# Patient Record
Sex: Male | Born: 1970 | Race: White | Hispanic: No | Marital: Married | Smoking: Current every day smoker
Health system: Southern US, Community
[De-identification: ages and names within clinical notes are randomized; demographics above are authoritative.]

## PROBLEM LIST (undated history)

## (undated) DIAGNOSIS — E7800 Pure hypercholesterolemia, unspecified: Secondary | ICD-10-CM

---

## 2001-05-08 ENCOUNTER — Encounter: Payer: Self-pay | Admitting: Urology

## 2001-05-08 ENCOUNTER — Ambulatory Visit (HOSPITAL_COMMUNITY): Admission: RE | Admit: 2001-05-08 | Discharge: 2001-05-08 | Payer: Self-pay | Admitting: Urology

## 2001-11-12 ENCOUNTER — Encounter (HOSPITAL_COMMUNITY): Admission: RE | Admit: 2001-11-12 | Discharge: 2001-12-12 | Payer: Self-pay | Admitting: Preventative Medicine

## 2002-09-16 ENCOUNTER — Encounter: Payer: Self-pay | Admitting: Internal Medicine

## 2002-09-16 ENCOUNTER — Ambulatory Visit (HOSPITAL_COMMUNITY): Admission: RE | Admit: 2002-09-16 | Discharge: 2002-09-16 | Payer: Self-pay | Admitting: Internal Medicine

## 2007-12-22 ENCOUNTER — Encounter: Payer: Self-pay | Admitting: Orthopedic Surgery

## 2007-12-22 ENCOUNTER — Emergency Department (HOSPITAL_COMMUNITY): Admission: EM | Admit: 2007-12-22 | Discharge: 2007-12-22 | Payer: Self-pay | Admitting: Emergency Medicine

## 2007-12-24 ENCOUNTER — Ambulatory Visit: Payer: Self-pay | Admitting: Orthopedic Surgery

## 2007-12-24 DIAGNOSIS — S52539A Colles' fracture of unspecified radius, initial encounter for closed fracture: Secondary | ICD-10-CM

## 2007-12-26 ENCOUNTER — Encounter: Payer: Self-pay | Admitting: Orthopedic Surgery

## 2008-01-22 ENCOUNTER — Ambulatory Visit: Payer: Self-pay | Admitting: Orthopedic Surgery

## 2008-02-05 ENCOUNTER — Ambulatory Visit: Payer: Self-pay | Admitting: Orthopedic Surgery

## 2008-02-20 ENCOUNTER — Ambulatory Visit: Payer: Self-pay | Admitting: Orthopedic Surgery

## 2012-05-10 LAB — TESTOSTERONE, FREE
Testosterone, % Free: 2
Testosterone,Free: 66.7

## 2016-01-10 ENCOUNTER — Emergency Department (HOSPITAL_COMMUNITY)
Admission: EM | Admit: 2016-01-10 | Discharge: 2016-01-10 | Disposition: A | Discharge status: Home / Self Care | Payer: PRIVATE HEALTH INSURANCE | Attending: Emergency Medicine | Admitting: Emergency Medicine

## 2016-01-10 ENCOUNTER — Encounter (HOSPITAL_COMMUNITY): Payer: Self-pay | Admitting: Emergency Medicine

## 2016-01-10 ENCOUNTER — Emergency Department (HOSPITAL_COMMUNITY): Payer: PRIVATE HEALTH INSURANCE

## 2016-01-10 DIAGNOSIS — F17210 Nicotine dependence, cigarettes, uncomplicated: Secondary | ICD-10-CM | POA: Insufficient documentation

## 2016-01-10 DIAGNOSIS — R0789 Other chest pain: Secondary | ICD-10-CM | POA: Diagnosis not present

## 2016-01-10 HISTORY — DX: Pure hypercholesterolemia, unspecified: E78.00

## 2016-01-10 LAB — BASIC METABOLIC PANEL
Anion gap: 7 (ref 5–15)
BUN: 16 mg/dL (ref 6–20)
CALCIUM: 8.9 mg/dL (ref 8.9–10.3)
CO2: 24 mmol/L (ref 22–32)
Chloride: 109 mmol/L (ref 101–111)
Creatinine, Ser: 0.96 mg/dL (ref 0.61–1.24)
GFR calc Af Amer: >60 mL/min (ref >60)
GLUCOSE: 92 mg/dL (ref 65–99)
Potassium: 3.6 mmol/L (ref 3.5–5.1)
SODIUM: 140 mmol/L (ref 135–145)

## 2016-01-10 LAB — CBC
HCT: 46.8 % (ref 39.0–52.0)
Hemoglobin: 16.2 g/dL (ref 13.0–17.0)
MCH: 31.5 pg (ref 26.0–34.0)
MCHC: 34.6 g/dL (ref 30.0–36.0)
MCV: 91.1 fL (ref 78.0–100.0)
PLATELETS: 292 10*3/uL (ref 150–400)
RBC: 5.14 MIL/uL (ref 4.22–5.81)
RDW: 12.8 % (ref 11.5–15.5)
WBC: 9.3 10*3/uL (ref 4.0–10.5)

## 2016-01-10 LAB — TROPONIN I

## 2016-01-10 LAB — D-DIMER, QUANTITATIVE (NOT AT ARMC): D DIMER QUANT: 0.28 ug{FEU}/mL (ref 0.00–0.50)

## 2016-01-10 NOTE — ED Provider Notes (Signed)
[facility] DEPT Provider Note   CSN: [id number] Arrival date & time: 01/10/16  1357     History   Chief Complaint Chief Complaint  Patient presents with  . Chest Pain    HPI Stephen Marshall is a 45 y.o. male.  The patient is a 45 year old male who reports that he has a history of high cholesterol but does not take medications, he also smokes occasional cigarettes but has no history of diabetes, no history of cardiac disease, no history of recent travel trauma injury surgery swelling or cancer. He reports that he has several family members who did have cardiac disease at an early age. He reports that 3 days ago he started to have a discomfort in his mid chest, this started while he was at rest on a tractor, it has continued constantly for the last 72 hours, there is nothing that seems to make this better or worse there is nothing that seems to cause the pain to intensify, it is associated with a electric shooting sensation down his left arm, it is not associated with shortness of breath, nausea, vomiting, diaphoresis or any other symptoms including fevers chills or coughing. He denies any personal history of pulmonary or cardiac abnormalities. He states that he was able to walk a mile today and this did not make his pain worse. Again it has been constant without relief for 72 hours    Chest Pain      Past Medical History:  Diagnosis Date  . High cholesterol     Patient Active Problem List   Diagnosis Date Noted  . COLLES' FRACTURE, RIGHT WRIST 12/24/2007    History reviewed. No pertinent surgical history.     Home Medications    Prior to Admission medications   Not on File    Family History Family History  Problem Relation Age of Onset  . Heart attack Father 86  . Cancer Father   . Stroke Father 51    Social History Social History  Substance Use Topics  . Smoking status: Current Every Day Smoker    Types: E-cigarettes  . Smokeless tobacco: Never Used   Comment: vape  . Alcohol use Yes     Allergies   Review of patient's allergies indicates no known allergies.   Review of Systems Review of Systems  Cardiovascular: Positive for chest pain.  All other systems reviewed and are negative.    Physical Exam Updated Vital Signs BP 126/87 (BP Location: Left Arm)   Pulse 88   Temp 98.7 F (37.1 C) (Oral)   Resp 18   Ht 5\' 11"  (1.803 m)   Wt 250 lb (113.4 kg)   SpO2 97%   BMI 34.87 kg/m   Physical Exam  Constitutional: He appears well-developed and well-nourished. No distress.  HENT:  Head: Normocephalic and atraumatic.  Mouth/Throat: Oropharynx is clear and moist. No oropharyngeal exudate.  Eyes: Conjunctivae and EOM are normal. Pupils are equal, round, and reactive to light. Right eye exhibits no discharge. Left eye exhibits no discharge. No scleral icterus.  Neck: Normal range of motion. Neck supple. No JVD present. No thyromegaly present.  Cardiovascular: Normal rate, regular rhythm, normal heart sounds and intact distal pulses.  Exam reveals no gallop and no friction rub.   No murmur heard. Pulmonary/Chest: Effort normal and breath sounds normal. No respiratory distress. He has no wheezes. He has no rales.  Abdominal: Soft. Bowel sounds are normal. He exhibits no distension and no mass. There is no tenderness.  Musculoskeletal: Normal range of motion. He exhibits no edema or tenderness.  Lymphadenopathy:    He has no cervical adenopathy.  Neurological: He is alert. Coordination normal.  Skin: Skin is warm and dry. No rash noted. No erythema.  Psychiatric: He has a normal mood and affect. His behavior is normal.  Nursing note and vitals reviewed.    ED Treatments / Results  Labs (all labs ordered are listed, but only abnormal results are displayed) Labs Reviewed  BASIC METABOLIC PANEL  CBC  TROPONIN I    EKG  EKG Interpretation  Date/Time:  Sunday January 10 2016 14:03:41 EDT Ventricular Rate:  92 PR  Interval:    QRS Duration: 86 QT Interval:  360 QTC Calculation: 446 R Axis:   100 Text Interpretation:  Sinus rhythm Ventricular premature complex Right axis deviation Baseline wander in lead(s) V1 No old tracing to compare Confirmed by Annalisia Ingber  MD, [place] ([id number]) on 01/10/2016 2:24:57 PM       Radiology No results found.  Procedures Procedures (including critical care time)  Medications Ordered in ED Medications - No data to display   Initial Impression / Assessment and Plan / ED Course  I have reviewed the triage vital signs and the nursing notes.  Pertinent labs & imaging results that were available during my care of the patient were reviewed by me and considered in my medical decision making (see chart for details).  Clinical Course    The labs have been ordered including d dimer and trop Exam unremarkable including VS and ECG Pt agreeable to w/u - HEART Pathway score of 3 - needs second trop  D/w Dr. Vanita Panda who will f/u second trop and d dimer at change of shift.  Final Clinical Impressions(s) / ED Diagnoses   Final diagnoses:  None    New Prescriptions New Prescriptions   No medications on file     Noemi Chapel, MD 01/10/16 1534

## 2016-01-10 NOTE — Discharge Instructions (Signed)
As discussed, your evaluation today has been largely reassuring.  But, it is important that you monitor your condition carefully, and do not hesitate to return to the ED if you develop new, or concerning changes in your condition. ? ?Otherwise, please follow-up with your physician for appropriate ongoing care. ? ?

## 2016-01-10 NOTE — ED Provider Notes (Signed)
6:35 PM Patient awake, alert. He and his wife are aware of all findings, including normal d-dimer, serial negative troponins. Patient has clear follow-up instructions, was discharged in stable condition.   Carmin Muskrat, MD 01/10/16 [phone]

## 2016-01-10 NOTE — ED Notes (Signed)
MD at bedside. 

## 2016-01-10 NOTE — ED Triage Notes (Signed)
Pt reports cp in left side of chest radiating to left arm x3 days.  Denies associated symptoms.  Pt also has had left sided facial numbness 2 hours ago, not numb at this time. Pt alert and oriented.

## 2016-01-13 ENCOUNTER — Encounter: Payer: Self-pay | Admitting: Cardiology

## 2016-01-13 ENCOUNTER — Ambulatory Visit (INDEPENDENT_AMBULATORY_CARE_PROVIDER_SITE_OTHER): Payer: PRIVATE HEALTH INSURANCE | Admitting: Cardiology

## 2016-01-13 VITALS — BP 112/78 | HR 90 | Ht 69.0 in | Wt 252.0 lb

## 2016-01-13 DIAGNOSIS — R0789 Other chest pain: Secondary | ICD-10-CM

## 2016-01-13 MED ORDER — IBUPROFEN 200 MG PO CAPS: 400.0000 mg | ORAL_CAPSULE | Freq: Three times a day (TID) | ORAL | 0 refills | Status: DC

## 2016-01-13 NOTE — Patient Instructions (Signed)
Medication Instructions:  Take OTC IBUPROFEN 400 MG - THREE TIMES DAILY FOR THE NEXT 7 DAYS   Labwork: NONE  Testing/Procedures: NONE  Follow-Up: Your physician recommends that you schedule a follow-up appointment in: 2 WEEKS    Any Other Special Instructions Will Be Listed Below (If Applicable).     If you need a refill on your cardiac medications before your next appointment, please call your pharmacy.

## 2016-01-13 NOTE — Progress Notes (Signed)
     Clinical Summary Stephen Marshall is a 45 y.o.male seen as a new patient, he is referred by Dr Noemi Chapel   1. Chest pain - seen in ER 01/10/16 with chest pain - negative troponins, D-dimer in ER. EKG with SR and no ischemic changes . CXR no acute process. - chest pain started about 1 week ago. Left sided, pressure like pain. 3/10 in severity. Occurred at rest. No other associated symptoms. Not positional. Lasted few hours constant. No relation to food - few days later had recurrent pain. Sunday started radiating down left arm. +belching around that time. Not better with tums. Lasted constant all weekend. Could be worst with deep breathing - has muscle spasm in arm as well aroudn tha ttime.    CAD risk factors: HL, mild tobacco x 3 years, father MI and CVA in 82s, maternal grandfather MI 51s.   Past Medical History:  Diagnosis Date  . High cholesterol      No Known Allergies   No current outpatient prescriptions on file.   No current facility-administered medications for this visit.      No past surgical history on file.   No Known Allergies    Family History  Problem Relation Age of Onset  . Heart attack Father 76  . Cancer Father   . Stroke Father 40     Social History Mr. Grannan reports that he has been smoking E-cigarettes.  He has never used smokeless tobacco. Mr. Monjaraz reports that he drinks alcohol.   Review of Systems CONSTITUTIONAL: No weight loss, fever, chills, weakness or fatigue.  HEENT: Eyes: No visual loss, blurred vision, double vision or yellow sclerae.No hearing loss, sneezing, congestion, runny nose or sore throat.  SKIN: No rash or itching.  CARDIOVASCULAR: per hpi RESPIRATORY: No shortness of breath, cough or sputum.  GASTROINTESTINAL: No anorexia, nausea, vomiting or diarrhea. No abdominal pain or blood.  GENITOURINARY: No burning on urination, no polyuria NEUROLOGICAL: No headache, dizziness, syncope, paralysis, ataxia, numbness or  tingling in the extremities. No change in bowel or bladder control.  MUSCULOSKELETAL: No muscle, back pain, joint pain or stiffness.  LYMPHATICS: No enlarged nodes. No history of splenectomy.  PSYCHIATRIC: No history of depression or anxiety.  ENDOCRINOLOGIC: No reports of sweating, cold or heat intolerance. No polyuria or polydipsia.  Marland Kitchen   Physical Examination Vitals:   01/13/16 1445  BP: 112/78  Pulse: 90   Vitals:   01/13/16 1445  Weight: 252 lb (114.3 kg)  Height: 5\' 9"  (1.753 m)    Gen: resting comfortably, no acute distress HEENT: no scleral icterus, pupils equal round and reactive, no palptable cervical adenopathy,  CV: RRR, no m/r/g, no jvd Resp: Clear to auscultation bilaterally GI: abdomen is soft, non-tender, non-distended, normal bowel sounds, no hepatosplenomegaly MSK: extremities are warm, no edema.  Skin: warm, no rash Neuro:  no focal deficits Psych: appropriate affect     Assessment and Plan  1. Chest pain - atypical chest pain, not cardiac in description. Probable MSK related pain - he will take ibuprofen 400mg  tid for 7 days and follow symptoms - no further cardiac testing at this time. He is to contact us if symptoms change or progress       Arnoldo Lenis, M.D.

## 2016-10-13 ENCOUNTER — Ambulatory Visit (HOSPITAL_COMMUNITY)
Admission: RE | Admit: 2016-10-13 | Discharge: 2016-10-13 | Disposition: A | Discharge status: Home / Self Care | Payer: PRIVATE HEALTH INSURANCE | Source: Ambulatory Visit | Attending: Pulmonary Disease | Admitting: Pulmonary Disease

## 2016-10-13 ENCOUNTER — Other Ambulatory Visit (HOSPITAL_COMMUNITY): Payer: Self-pay | Admitting: Pulmonary Disease

## 2016-10-13 DIAGNOSIS — R059 Cough, unspecified: Secondary | ICD-10-CM

## 2016-10-13 DIAGNOSIS — R05 Cough: Secondary | ICD-10-CM

## 2018-10-02 ENCOUNTER — Other Ambulatory Visit: Payer: Self-pay | Admitting: Pulmonary Disease

## 2018-10-02 ENCOUNTER — Ambulatory Visit (HOSPITAL_COMMUNITY)
Admission: RE | Admit: 2018-10-02 | Discharge: 2018-10-02 | Disposition: A | Discharge status: Home / Self Care | Payer: PRIVATE HEALTH INSURANCE | Source: Ambulatory Visit | Attending: Pulmonary Disease | Admitting: Pulmonary Disease

## 2018-10-02 ENCOUNTER — Other Ambulatory Visit: Payer: Self-pay

## 2018-10-02 DIAGNOSIS — I159 Secondary hypertension, unspecified: Secondary | ICD-10-CM | POA: Insufficient documentation

## 2018-10-02 DIAGNOSIS — R519 Headache, unspecified: Secondary | ICD-10-CM

## 2018-10-02 DIAGNOSIS — R51 Headache: Secondary | ICD-10-CM | POA: Insufficient documentation

## 2018-10-04 ENCOUNTER — Other Ambulatory Visit: Payer: PRIVATE HEALTH INSURANCE

## 2018-10-04 ENCOUNTER — Telehealth: Payer: Self-pay | Admitting: *Deleted

## 2018-10-04 DIAGNOSIS — Z20822 Contact with and (suspected) exposure to covid-19: Secondary | ICD-10-CM

## 2018-10-04 NOTE — Telephone Encounter (Signed)
I received a call from [place] at Dr. Sinda Du office requesting COVID-19 testing be scheduled for this pt.   I called pt and scheduled him for today at 1:15 for COVID-19 testing at the [facility] in [place].   I made him aware to wear a mask and stay in the car.  Order entered  [employer]

## 2018-10-08 LAB — NOVEL CORONAVIRUS, NAA: SARS-CoV-2, NAA: NOT DETECTED

## 2019-04-08 ENCOUNTER — Ambulatory Visit: Payer: PRIVATE HEALTH INSURANCE | Attending: Internal Medicine

## 2019-04-08 ENCOUNTER — Other Ambulatory Visit: Payer: Self-pay

## 2019-04-08 DIAGNOSIS — Z20822 Contact with and (suspected) exposure to covid-19: Secondary | ICD-10-CM

## 2019-04-10 LAB — NOVEL CORONAVIRUS, NAA: SARS-CoV-2, NAA: NOT DETECTED

## 2019-05-04 ENCOUNTER — Telehealth: Payer: Self-pay

## 2019-05-04 NOTE — Telephone Encounter (Signed)
Opened to abstract chart

## 2019-07-15 ENCOUNTER — Ambulatory Visit: Payer: PRIVATE HEALTH INSURANCE | Admitting: Family Medicine

## 2019-12-12 IMAGING — CT CT HEAD WITHOUT CONTRAST
3 series · 15 of 47 positions shown, 18 images · non-contrast
Comparison: None.

CLINICAL DATA: Headaches for 6 days with hypertension.

EXAM:
CT HEAD WITHOUT CONTRAST
TECHNIQUE: Contiguous axial images were obtained from the base of the skull
through the vertex without intravenous contrast.

[Series 2: head w o · axial · 0.47mm/px · z∈[+79,+214]mm · 9 of 33 slices shown, 12 images]
[im 3/33  brain]
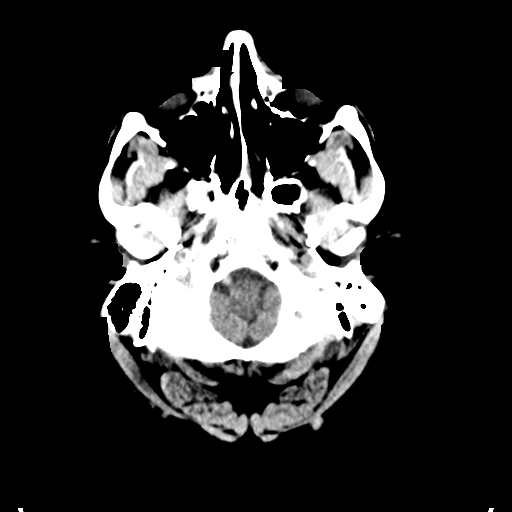
[im 3/33  bone]
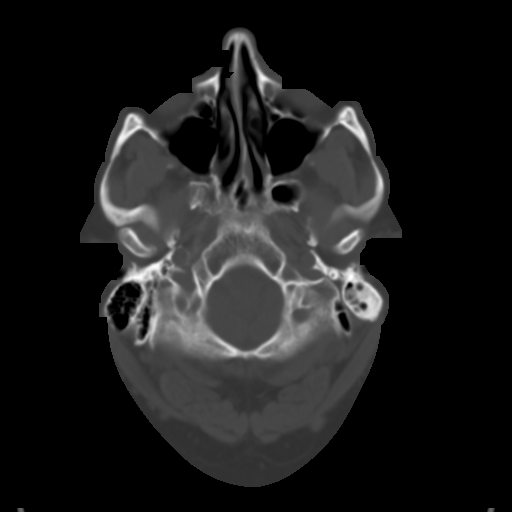
[im 6/33  brain]
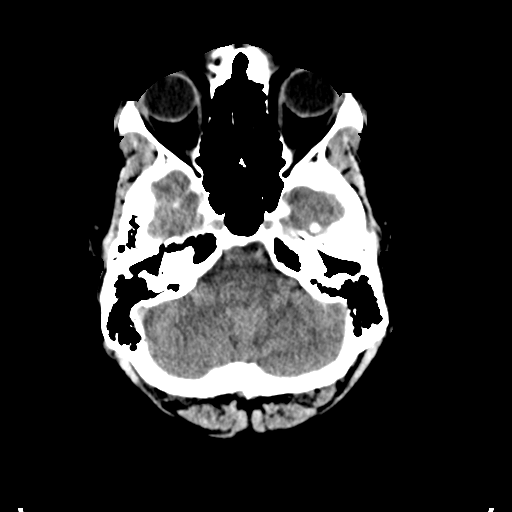
[im 9/33  brain]
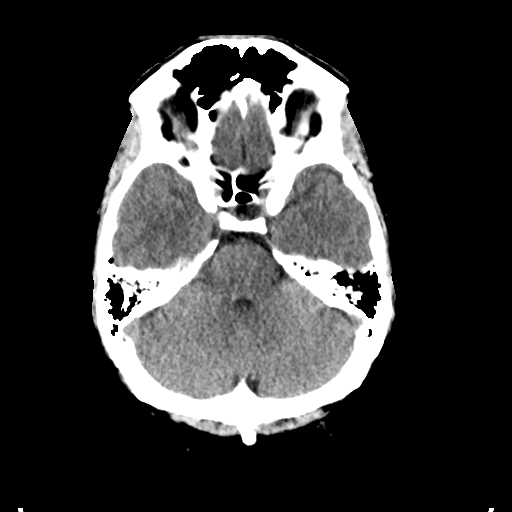
[im 13/33  brain]
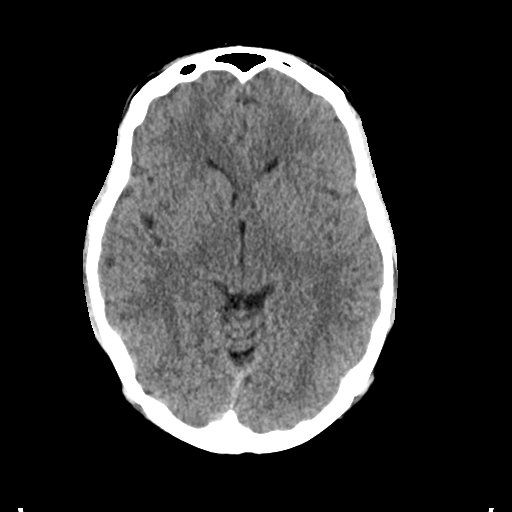
[im 17/33  brain]
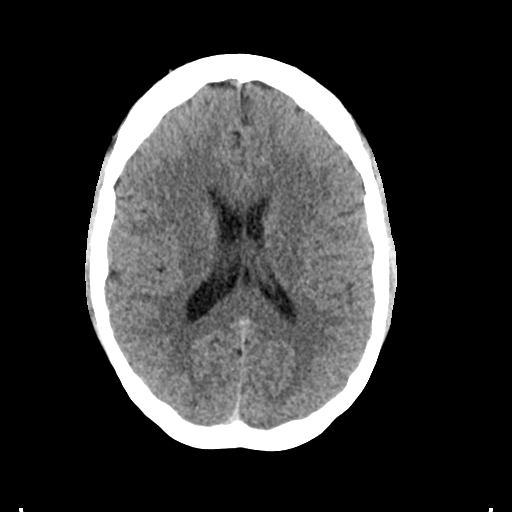
[im 17/33  bone]
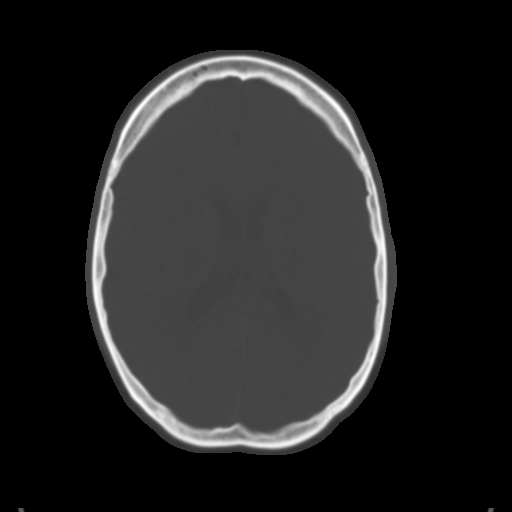
[im 20/33  brain]
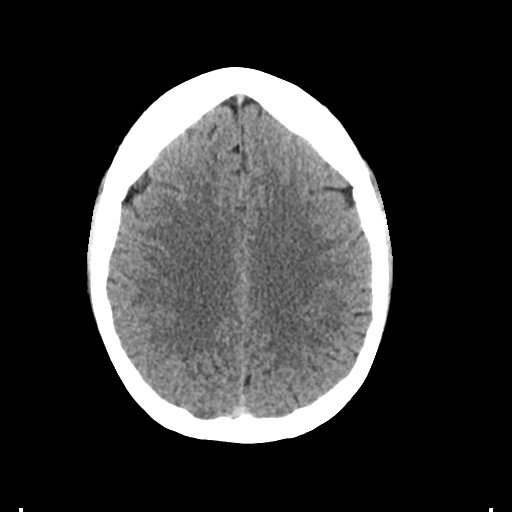
[im 24/33  brain]
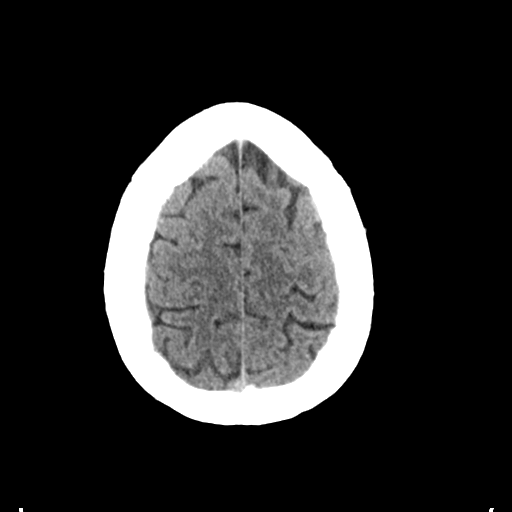
[im 27/33  brain]
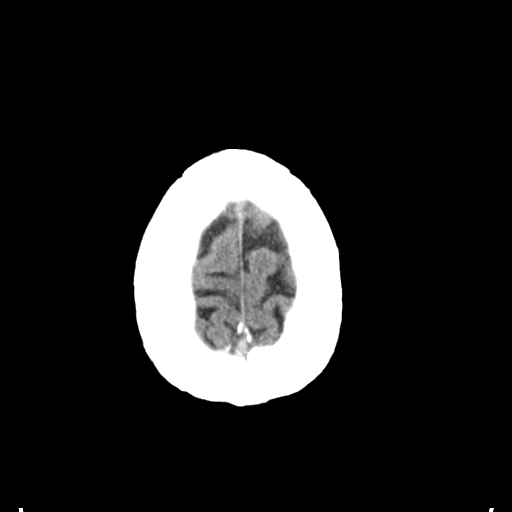
[im 30/33  brain]
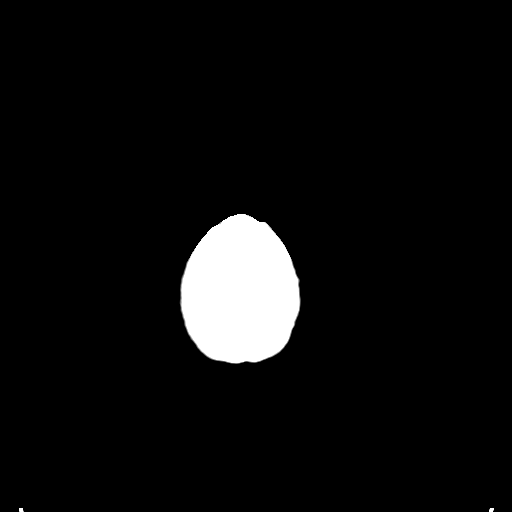
[im 30/33  bone]
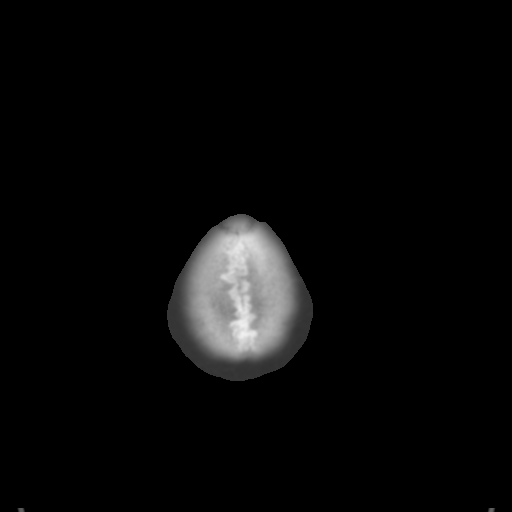

[Series 4: coronal soft · coronal · 0.31mm/px · 3 of 72 slices shown]
[im 24/72  brain]
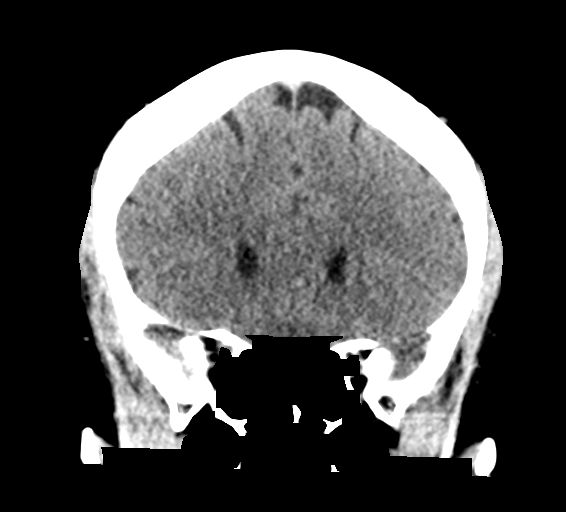
[im 32/72  brain]
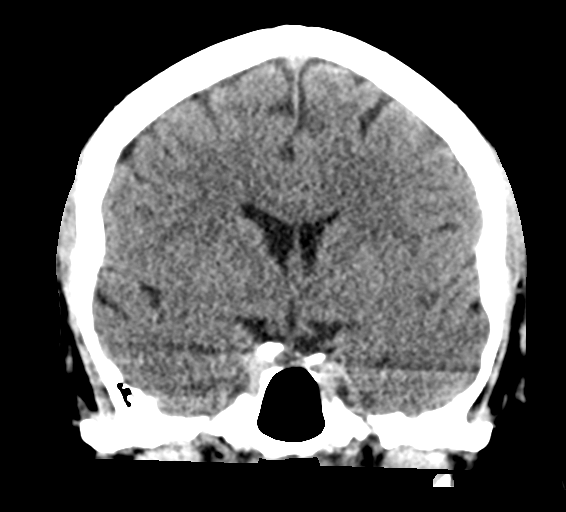
[im 40/72  brain]
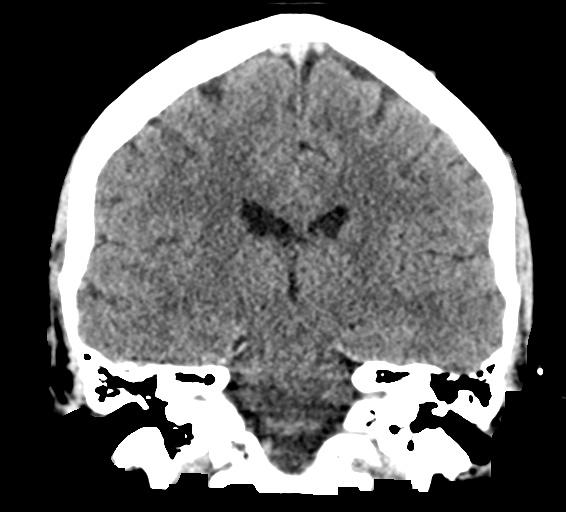

[Series 5: sagittal soft · sagittal · 0.31mm/px · 3 of 60 slices shown]
[im 20/60  brain]
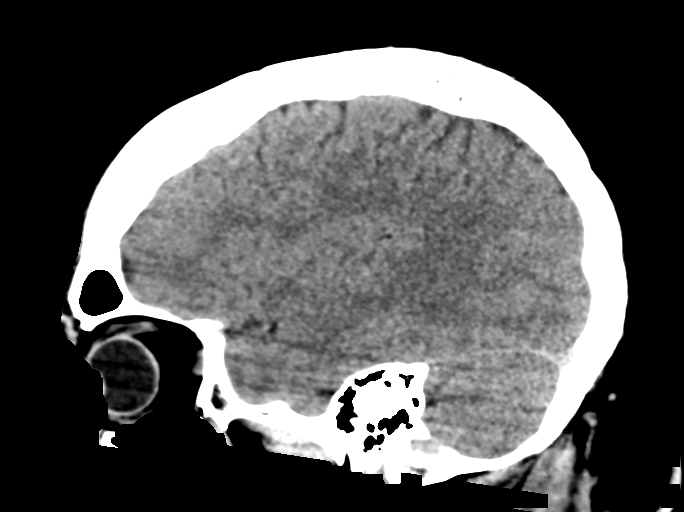
[im 30/60  brain]
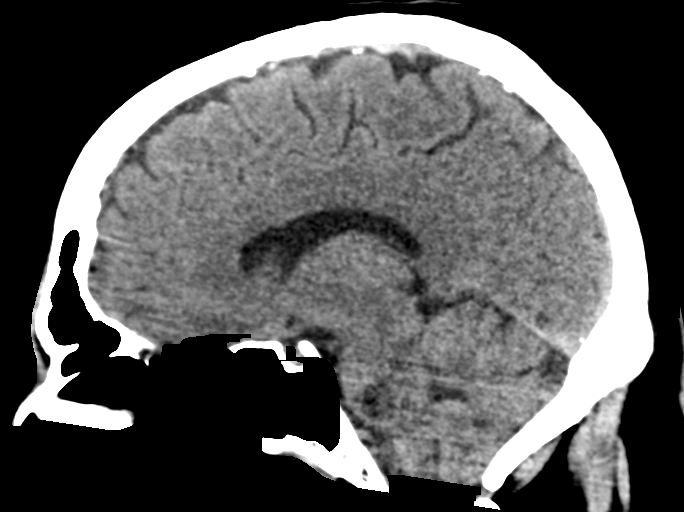
[im 40/60  brain]
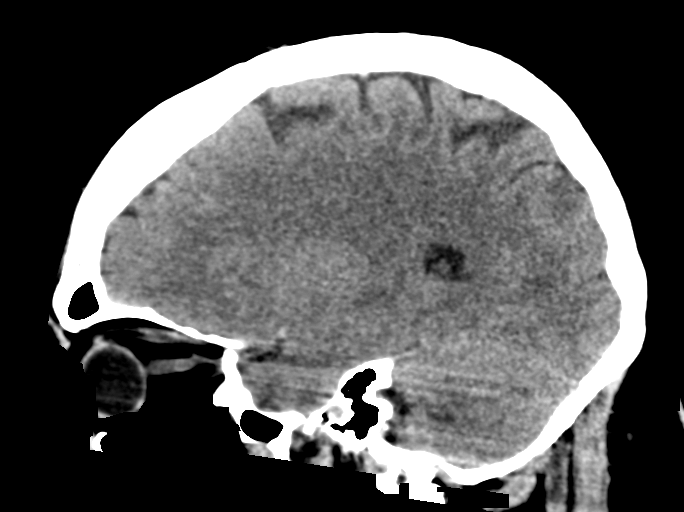

[15 of 47 positions shown; findings below may reference images not displayed]

FINDINGS: Brain: There is no evidence of acute infarct, intracranial
hemorrhage, mass, midline shift, or extra-axial fluid collection.
The ventricles and sulci are normal.

Vascular: No hyperdense vessel.

Skull: No fracture or focal osseous lesion.

Sinuses/Orbits: Visualized paranasal sinuses are clear. There is a
small left mastoid effusion. The orbits are unremarkable.

Other: None.
IMPRESSION: Unremarkable CT appearance of the brain.

## 2020-01-31 ENCOUNTER — Other Ambulatory Visit: Payer: Self-pay

## 2020-01-31 ENCOUNTER — Other Ambulatory Visit: Payer: PRIVATE HEALTH INSURANCE

## 2020-01-31 DIAGNOSIS — Z20822 Contact with and (suspected) exposure to covid-19: Secondary | ICD-10-CM

## 2020-02-01 LAB — NOVEL CORONAVIRUS, NAA: SARS-CoV-2, NAA: NOT DETECTED

## 2020-02-01 LAB — SARS-COV-2, NAA 2 DAY TAT

## 2020-04-21 ENCOUNTER — Other Ambulatory Visit: Payer: PRIVATE HEALTH INSURANCE

## 2020-05-11 ENCOUNTER — Other Ambulatory Visit: Payer: Self-pay

## 2020-05-11 NOTE — Progress Notes (Signed)
[facility] specimen ID: [id number]

## 2020-08-31 ENCOUNTER — Encounter: Payer: Self-pay | Admitting: Cardiology

## 2021-05-28 ENCOUNTER — Telehealth (HOSPITAL_COMMUNITY): Payer: Self-pay | Admitting: Vascular Surgery

## 2021-05-28 NOTE — Telephone Encounter (Signed)
Left VM to referring office [facility] nurse line, that pt is not appropriate for the AHF clinic

## 2021-06-03 ENCOUNTER — Ambulatory Visit (INDEPENDENT_AMBULATORY_CARE_PROVIDER_SITE_OTHER): Payer: PRIVATE HEALTH INSURANCE | Admitting: Cardiology

## 2021-06-03 ENCOUNTER — Other Ambulatory Visit: Payer: Self-pay

## 2021-06-03 ENCOUNTER — Encounter: Payer: Self-pay | Admitting: Cardiology

## 2021-06-03 VITALS — BP 126/90 | HR 85 | Ht 69.0 in | Wt 255.0 lb

## 2021-06-03 DIAGNOSIS — R079 Chest pain, unspecified: Secondary | ICD-10-CM | POA: Diagnosis not present

## 2021-06-03 DIAGNOSIS — R072 Precordial pain: Secondary | ICD-10-CM

## 2021-06-03 DIAGNOSIS — Z01812 Encounter for preprocedural laboratory examination: Secondary | ICD-10-CM | POA: Diagnosis not present

## 2021-06-03 DIAGNOSIS — R030 Elevated blood-pressure reading, without diagnosis of hypertension: Secondary | ICD-10-CM | POA: Insufficient documentation

## 2021-06-03 DIAGNOSIS — Z720 Tobacco use: Secondary | ICD-10-CM | POA: Insufficient documentation

## 2021-06-03 DIAGNOSIS — Z8249 Family history of ischemic heart disease and other diseases of the circulatory system: Secondary | ICD-10-CM | POA: Insufficient documentation

## 2021-06-03 DIAGNOSIS — E7800 Pure hypercholesterolemia, unspecified: Secondary | ICD-10-CM | POA: Insufficient documentation

## 2021-06-03 MED ORDER — METOPROLOL TARTRATE 100 MG PO TABS: ORAL_TABLET | ORAL | 0 refills | Status: DC

## 2021-06-03 NOTE — Assessment & Plan Note (Signed)
Given his strong early family history, father age 50 myocardial infarction, grandfather in his 45s, mother with aortic valve replacement, periodic smoker with stress, chest discomfort mostly at rest with radiation down his left arm, hypertension, hyperlipidemia, we will go ahead and proceed with coronary CT scan with possible FFR analysis.  Performed at [facility].  Metoprolol prior to scan for heart rate reduction per protocol.  We will follow-up with results of study.

## 2021-06-03 NOTE — Assessment & Plan Note (Signed)
Asked him to continue to monitor his blood pressures at home.  Diastolic today was 90.

## 2021-06-03 NOTE — Assessment & Plan Note (Addendum)
Continue with atorvastatin 40 mg once a day.  Excellent.'s are being followed by Terressa Koyanagi, FNP in [place] working with Delphina Cahill, MD. no myalgias.  Tolerating current medical management well.

## 2021-06-03 NOTE — Assessment & Plan Note (Signed)
Father, mother as above.

## 2021-06-03 NOTE — Progress Notes (Signed)
Cardiology Office Note:    Date:  06/03/2021   ID:  Stephen Marshall, DOB Jul 08, 1970, MRN [id number]  PCP:  Sinda Du, MD  Cardiologist:  Candee Furbish, MD    Referring MD: Donnamae Jude, FNP     History of Present Illness:    Stephen Marshall is a 51 y.o. male with a hx of high cholesterol here today for the evaluation of chest pain at the request of Eloy End, [place]. His father had an MI and stroke at 63 and his maternal grandfather had an MI in his 12. His mother had an aortic valve replacement.   He had presented to the ED 01/10/16 with atypical chest pain. He had high cholesterol but was not on medications was an occasional smoker at the time. 3 days prior, he started feeling a discomfort in his central chest while he was sitting on his tractor. The pain persisted for 72 hours and nothing caused the pain to improve or intensify. The pain had an associated electric shooting sensation down his L arm. He had a HEART Pathway score of 3 and endorsed a family history of cardiovascular disease. D-dimer was normal, troponins were negative, and he was discharged in stable condition.   He was contacted by Eloy End, [place] 05/28/21 because he was not a candidate for the [facility].   Today, he reports the chest pain starts on his L-side and the pain will shoot down his L arm. The pain occurs sporadically and primarily when he is at rest. For example, he will be watching television when he feels the pain start. The pain starts as a pressure that can last for 1 to 2 hours. He has not had an episode in the past few days.   His blood pressure has fluctuated in the past. He associates the fluctuations to stress from work. He does not record his blood pressure at home but does have a home machine.   Recently, he has noticed intermittent headaches.  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 He is compliant with his medications. Of note, he only smokes when he is stressed.   He denies any  palpitations, shortness of breath, lightheadedness, syncope, orthopnea, PND, lower extremity edema or exertional symptoms.  Past Medical History:  Diagnosis Date   High cholesterol     No past surgical history on file.  Current Medications: Current Meds  Medication Sig   ALPRAZolam (XANAX) 0.25 MG tablet Take 0.25 mg by mouth as needed.   aspirin EC 81 MG tablet Take 81 mg by mouth daily.   atorvastatin (LIPITOR) 40 MG tablet Take 40 mg by mouth daily.   buPROPion (WELLBUTRIN SR) 150 MG 12 hr tablet Take 150 mg by mouth 2 (two) times daily.   busPIRone (BUSPAR) 15 MG tablet Take 15 mg by mouth daily.   Ibuprofen 200 MG CAPS Take 2 capsules (400 mg total) by mouth 3 (three) times daily.   metoprolol tartrate (LOPRESSOR) 100 MG tablet Take 1 tablet (2) hours before your CT scan   nitroGLYCERIN (NITROSTAT) 0.4 MG SL tablet 0.4 mg every 5 (five) minutes as needed for chest pain (as needed for chest pain).   omeprazole (PRILOSEC) 20 MG capsule Take 20 mg by mouth daily.   OTEZLA 30 MG TABS Take 1 tablet by mouth 2 (two) times daily.   rOPINIRole (REQUIP) 4 MG tablet Take 4 mg by mouth at bedtime.     Allergies:   Patient has no known  allergies.   Social History   Socioeconomic History   Marital status: Married    Spouse name: Not on file   Number of children: Not on file   Years of education: Not on file   Highest education level: Not on file  Occupational History   Not on file  Tobacco Use   Smoking status: Every Day    Types: E-cigarettes   Smokeless tobacco: Never   Tobacco comments:    vape  Substance and Sexual Activity   Alcohol use: Yes   Drug use: No   Sexual activity: Yes    Partners: Female  Other Topics Concern   Not on file  Social History Narrative   ** Merged History Encounter **       Social Determinants of Health   Financial Resource Strain: Not on file  Food Insecurity: Not on file  Transportation Needs: Not on file  Physical Activity: Not on  file  Stress: Not on file  Social Connections: Not on file     Family History: The patient's family history includes Cancer in his father; Heart attack (age of onset: 18) in his father; Heart attack (age of onset: 59) in his maternal grandfather; Stroke (age of onset: 70) in his father.  ROS:   Please see the history of present illness.    (+) Chest pain (+) Headache (+) Tobacco use All other systems reviewed and negative.   EKGs/Labs/Other Studies Reviewed:    The following studies were reviewed today: No prior cardiovascular studies  EKG:  EKG was personally reviewed 06/03/21: Sinus rhythm, rate 85 bpm  Recent Labs: No results found for requested labs within last 8760 hours.   Recent Lipid Panel No results found for: CHOL, TRIG, HDL, CHOLHDL, VLDL, LDLCALC, LDLDIRECT  CHA2DS2-VASc Score =   [ ] .  Therefore, the patient's annual risk of stroke is   %.        Physical Exam:    VS:  BP 126/90 (BP Location: Left Arm, Patient Position: Sitting, Cuff Size: Normal)    Pulse 85    Ht 5\' 9"  (1.753 m)    Wt 255 lb (115.7 kg)    SpO2 97%    BMI 37.66 kg/m     Wt Readings from Last 3 Encounters:  06/03/21 255 lb (115.7 kg)  01/13/16 252 lb (114.3 kg)  01/10/16 250 lb (113.4 kg)     GEN:  Well nourished, well developed in no acute distress HEENT: Normal NECK: No JVD; No carotid bruits LYMPHATICS: No lymphadenopathy CARDIAC: RRR, no murmurs, rubs, gallops RESPIRATORY:  Clear to auscultation without rales, wheezing or rhonchi  ABDOMEN: Soft, non-tender, non-distended MUSCULOSKELETAL:  No edema; No deformity  SKIN: Warm and dry NEUROLOGIC:  Alert and oriented x 3 PSYCHIATRIC:  Normal affect   ASSESSMENT:    1. Precordial pain   2. Pre-procedure lab exam   3. Chest pain of uncertain etiology   4. Pure hypercholesterolemia   5. Elevated blood pressure reading   6. Tobacco use   7. Family history of early CAD    PLAN:    Chest pain of uncertain etiology Given his  strong early family history, father age 70 myocardial infarction, grandfather in his 62s, mother with aortic valve replacement, periodic smoker with stress, chest discomfort mostly at rest with radiation down his left arm, hypertension, hyperlipidemia, we will go ahead and proceed with coronary CT scan with possible FFR analysis.  Performed at [facility].  Metoprolol prior to scan  for heart rate reduction per protocol.  We will follow-up with results of study.  Pure hypercholesterolemia Continue with atorvastatin 40 mg once a day.  Excellent.'s are being followed by Terressa Koyanagi, FNP in [place] working with Delphina Cahill, MD. no myalgias.  Tolerating current medical management well.  Elevated blood pressure reading Asked him to continue to monitor his blood pressures at home.  Diastolic today was 90.  Tobacco use Encourage complete tobacco cessation.  He does admit that when he is stressed sometimes he will smoke.   Family history of early CAD Father, mother as above.       Medication Adjustments/Labs and Tests Ordered: Current medicines are reviewed at length with the patient today.  Concerns regarding medicines are outlined above.  Orders Placed This Encounter  Procedures   CT CORONARY MORPH W/CTA COR W/SCORE W/CA W/CM &/OR WO/CM   EKG 12-Lead   Meds ordered this encounter  Medications   metoprolol tartrate (LOPRESSOR) 100 MG tablet    Sig: Take 1 tablet (2) hours before your CT scan    Dispense:  1 tablet    Refill:  0   I,Mykaella Javier,acting as a scribe for [employer], MD.,have documented all relevant documentation on the behalf of Candee Furbish, MD,as directed by  Candee Furbish, MD while in the presence of Candee Furbish, MD.  I, Candee Furbish, MD, have reviewed all documentation for this visit. The documentation on 06/03/21 for the exam, diagnosis, procedures, and orders are all accurate and complete.   Signed, Candee Furbish, MD  06/03/2021 2:39 PM    [place]

## 2021-06-03 NOTE — Patient Instructions (Addendum)
Medication Instructions:  The current medical regimen is effective;  continue present plan and medications.  *If you need a refill on your cardiac medications before your next appointment, please call your pharmacy*  Lab Work: Recently had at Dr Edwyna Ready Hall's office 05/19/2021 - BUN 11, Crea 1.15  Testing/Procedures:   Your cardiac CT will be scheduled at:   [facility] [address] [place], [place] [address] [phone]  Please arrive at the [facility] main entrance (entrance A) of [facility] 30 minutes prior to test start time. You can use the FREE valet parking offered at the main entrance (encouraged to control the heart rate for the test) Proceed to the [facility] Radiology Department (first floor) to check-in and test prep.  Please follow these instructions carefully (unless otherwise directed):  Hold all erectile dysfunction medications at least 3 days (72 hrs) prior to test.  On the Night Before the Test: Be sure to Drink plenty of water. Do not consume any caffeinated/decaffeinated beverages or chocolate 12 hours prior to your test. Do not take any antihistamines 12 hours prior to your test.  On the Day of the Test: Drink plenty of water until 1 hour prior to the test. Do not eat any food 4 hours prior to the test. You may take your regular medications prior to the test.  Take metoprolol (Lopressor) two hours prior to test. HOLD Furosemide/Hydrochlorothiazide morning of the test.  After the Test: Drink plenty of water. After receiving IV contrast, you may experience a mild flushed feeling. This is normal. On occasion, you may experience a mild rash up to 24 hours after the test. This is not dangerous. If this occurs, you can take Benadryl 25 mg and increase your fluid intake. If you experience trouble breathing, this can be serious. If it is severe call 911 IMMEDIATELY. If it is mild, please call our office. If you take any of these  medications: Glipizide/Metformin, Avandament, Glucavance, please do not take 48 hours after completing test unless otherwise instructed.  We will call to schedule your test 2-4 weeks out understanding that some insurance companies will need an authorization prior to the service being performed.   For non-scheduling related questions, please contact the cardiac imaging nurse navigator should you have any questions/concerns: Marchia Bond, Cardiac Imaging Nurse Navigator Gordy Clement, Cardiac Imaging Nurse Navigator  Heart and Vascular Services Direct Office Dial: [phone]   For scheduling needs, including cancellations and rescheduling, please call [place], [phone].  Follow-Up: At [facility], you and your health needs are our priority.  As part of our continuing mission to provide you with exceptional heart care, we have created designated Provider Care Teams.  These Care Teams include your primary Cardiologist (physician) and Advanced Practice Providers (APPs -  Physician Assistants and Nurse Practitioners) who all work together to provide you with the care you need, when you need it.  We recommend signing up for the patient portal called "MyChart".  Sign up information is provided on this After Visit Summary.  MyChart is used to connect with patients for Virtual Visits (Telemedicine).  Patients are able to view lab/test results, encounter notes, upcoming appointments, etc.  Non-urgent messages can be sent to your provider as well.   To learn more about what you can do with MyChart, go to [email or web address].    Your next appointment:   Follow up will be determine based on the results of the above testing.  Thank you for choosing [facility]!!

## 2021-06-03 NOTE — Assessment & Plan Note (Signed)
Encourage complete tobacco cessation.  He does admit that when he is stressed sometimes he will smoke.

## 2021-06-14 ENCOUNTER — Telehealth (HOSPITAL_COMMUNITY): Payer: Self-pay | Admitting: *Deleted

## 2021-06-14 NOTE — Telephone Encounter (Signed)
Reaching out to patient to offer assistance regarding upcoming cardiac imaging study; pt verbalizes understanding of appt date/time, parking situation and where to check in, pre-test NPO status and medications ordered, and verified current allergies; name and call back number provided for further questions should they arise ? ?Gordy Clement RN Navigator Cardiac Imaging ? Heart and Vascular ?[phone] office ?[phone] cell ? ?Patient to take '100mg'$  metoprolol tartrate two hours prior to his cardiac CT scan.  He is aware to arrive at 12:30pm for his 1pm scan. ?

## 2021-06-15 ENCOUNTER — Other Ambulatory Visit: Payer: Self-pay

## 2021-06-15 ENCOUNTER — Ambulatory Visit (HOSPITAL_COMMUNITY)
Admission: RE | Admit: 2021-06-15 | Discharge: 2021-06-15 | Disposition: A | Discharge status: Home / Self Care | Payer: PRIVATE HEALTH INSURANCE | Source: Ambulatory Visit | Attending: Cardiology | Admitting: Cardiology

## 2021-06-15 DIAGNOSIS — R072 Precordial pain: Secondary | ICD-10-CM

## 2021-06-15 MED ORDER — METOPROLOL TARTRATE 5 MG/5ML IV SOLN
INTRAVENOUS | Status: AC
  Administered 2021-06-15: 10 mg via INTRAVENOUS
  Filled 2021-06-15: qty 20

## 2021-06-15 MED ORDER — DILTIAZEM HCL 25 MG/5ML IV SOLN
INTRAVENOUS | Status: AC
  Filled 2021-06-15: qty 5

## 2021-06-15 MED ORDER — DILTIAZEM HCL 25 MG/5ML IV SOLN
10.0000 mg | INTRAVENOUS | Status: DC | PRN
Start: 2021-06-15 — End: 2021-06-16
  Administered 2021-06-15: 10 mg via INTRAVENOUS

## 2021-06-15 MED ORDER — NITROGLYCERIN 0.4 MG SL SUBL
0.8000 mg | SUBLINGUAL_TABLET | Freq: Once | SUBLINGUAL | Status: DC
Start: 2021-06-15 — End: 2021-06-16

## 2021-06-15 MED ORDER — NITROGLYCERIN 0.4 MG SL SUBL
SUBLINGUAL_TABLET | SUBLINGUAL | Status: AC
  Filled 2021-06-15: qty 2

## 2021-06-15 MED ORDER — IOHEXOL 350 MG/ML SOLN
95.0000 mL | Freq: Once | INTRAVENOUS | Status: AC | PRN
  Administered 2021-06-15: 95 mL via INTRAVENOUS

## 2021-06-15 MED ORDER — METOPROLOL TARTRATE 5 MG/5ML IV SOLN
10.0000 mg | INTRAVENOUS | Status: DC | PRN
Start: 2021-06-15 — End: 2021-06-16
  Administered 2021-06-15: 10 mg via INTRAVENOUS

## 2022-03-01 ENCOUNTER — Encounter: Payer: Self-pay | Admitting: *Deleted

## 2022-03-17 ENCOUNTER — Encounter: Payer: Self-pay | Admitting: *Deleted

## 2022-03-17 NOTE — Patient Instructions (Signed)
  Procedure: Colonoscopy  Estimated body mass index is 32.08 kg/m as calculated from the following:   Height as of this encounter: '5\' 11"'$  (1.803 m).   Weight as of this encounter: 230 lb (104.3 kg).   Have you had a colonoscopy before?  no  Do you have family history of colon cancer?  no  Do you have a family history of polyps? yes  Previous colonoscopy with polyps removed? no  Do you have a history colorectal cancer?   no  Are you diabetic?  no  Do you have a prosthetic or mechanical heart valve? no  Do you have a pacemaker/defibrillator?   no  Have you had endocarditis/atrial fibrillation?  no  Do you use supplemental oxygen/CPAP?  yes  Have you had joint replacement within the last 12 months?  no  Do you tend to be constipated or have to use laxatives?  no   Do you have history of alcohol use? If yes, how much and how often.  no  Do you have history or are you using drugs? If yes, what do are you  using?  no  Have you ever had a stroke/heart attack?  no  Have you ever had a heart or other vascular stent placed,?no  Do you take weight loss medication? no  Do you take any blood-thinning medications such as: (Plavix, aspirin, Coumadin, Aggrenox, Brilinta, Xarelto, Eliquis, Pradaxa, Savaysa or Effient)? no  If yes we need the name, milligram, dosage and who is prescribing doctor:               Current Outpatient Medications  Medication Sig Dispense Refill   atorvastatin (LIPITOR) 80 MG tablet Take 80 mg by mouth daily.     buPROPion (WELLBUTRIN SR) 150 MG 12 hr tablet Take 150 mg by mouth 2 (two) times daily.     busPIRone (BUSPAR) 15 MG tablet Take 7.5 mg by mouth 3 (three) times daily.     OTEZLA 30 MG TABS Take 1 tablet by mouth 2 (two) times daily.     rOPINIRole (REQUIP) 4 MG tablet Take 4 mg by mouth at bedtime.     Ibuprofen 200 MG CAPS Take 2 capsules (400 mg total) by mouth 3 (three) times daily. 120 each 0   nitroGLYCERIN (NITROSTAT) 0.4 MG SL tablet 0.4  mg every 5 (five) minutes as needed for chest pain (as needed for chest pain).     No current facility-administered medications for this visit.    No Known Allergies

## 2022-04-19 ENCOUNTER — Encounter: Payer: Self-pay | Admitting: *Deleted

## 2022-04-19 MED ORDER — PEG 3350-KCL-NA BICARB-NACL 420 G PO SOLR: 4000.0000 mL | Freq: Once | ORAL | 0 refills | Status: AC

## 2022-04-19 NOTE — Progress Notes (Signed)
ASA 2. Appropriate.  ?

## 2022-04-19 NOTE — Progress Notes (Signed)
Pt has been scheduled for 05/13/22 with Dr.Carver. Instructions mailed and prep sent to the pharmacy

## 2022-04-27 ENCOUNTER — Encounter: Payer: Self-pay | Admitting: *Deleted

## 2022-05-13 ENCOUNTER — Encounter (HOSPITAL_COMMUNITY)
Admission: RE | Disposition: A | Discharge status: Home / Self Care | Payer: Self-pay | Source: Home / Self Care | Attending: Internal Medicine

## 2022-05-13 ENCOUNTER — Encounter (HOSPITAL_COMMUNITY): Payer: Self-pay

## 2022-05-13 ENCOUNTER — Ambulatory Visit (HOSPITAL_COMMUNITY): Payer: PRIVATE HEALTH INSURANCE | Admitting: Anesthesiology

## 2022-05-13 ENCOUNTER — Ambulatory Visit (HOSPITAL_COMMUNITY)
Admission: RE | Admit: 2022-05-13 | Discharge: 2022-05-13 | Disposition: A | Discharge status: Home / Self Care | Payer: PRIVATE HEALTH INSURANCE | Attending: Internal Medicine | Admitting: Internal Medicine

## 2022-05-13 ENCOUNTER — Other Ambulatory Visit: Payer: Self-pay

## 2022-05-13 DIAGNOSIS — F419 Anxiety disorder, unspecified: Secondary | ICD-10-CM | POA: Diagnosis not present

## 2022-05-13 DIAGNOSIS — D122 Benign neoplasm of ascending colon: Secondary | ICD-10-CM | POA: Insufficient documentation

## 2022-05-13 DIAGNOSIS — Z1211 Encounter for screening for malignant neoplasm of colon: Secondary | ICD-10-CM

## 2022-05-13 DIAGNOSIS — D125 Benign neoplasm of sigmoid colon: Secondary | ICD-10-CM | POA: Diagnosis not present

## 2022-05-13 DIAGNOSIS — F17290 Nicotine dependence, other tobacco product, uncomplicated: Secondary | ICD-10-CM | POA: Insufficient documentation

## 2022-05-13 HISTORY — PX: COLONOSCOPY WITH PROPOFOL: SHX5780

## 2022-05-13 HISTORY — PX: POLYPECTOMY: SHX5525

## 2022-05-13 SURGERY — COLONOSCOPY WITH PROPOFOL
Anesthesia: General

## 2022-05-13 MED ORDER — LIDOCAINE HCL (CARDIAC) PF 100 MG/5ML IV SOSY
PREFILLED_SYRINGE | INTRAVENOUS | Status: DC | PRN
  Administered 2022-05-13: 50 mg via INTRAVENOUS

## 2022-05-13 MED ORDER — PROPOFOL 10 MG/ML IV BOLUS
INTRAVENOUS | Status: DC | PRN
  Administered 2022-05-13: 30 mg via INTRAVENOUS
  Administered 2022-05-13 (×2): 50 mg via INTRAVENOUS
  Administered 2022-05-13: 100 mg via INTRAVENOUS
  Administered 2022-05-13: 30 mg via INTRAVENOUS
  Administered 2022-05-13: 20 mg via INTRAVENOUS

## 2022-05-13 MED ORDER — LACTATED RINGERS IV SOLN: INTRAVENOUS | Status: DC

## 2022-05-13 NOTE — Discharge Instructions (Addendum)
  Colonoscopy Discharge Instructions  Read the instructions outlined below and refer to this sheet in the next few weeks. These discharge instructions provide you with general information on caring for yourself after you leave the hospital. Your doctor may also give you specific instructions. While your treatment has been planned according to the most current medical practices available, unavoidable complications occasionally occur.   ACTIVITY You may resume your regular activity, but move at a slower pace for the next 24 hours.  Take frequent rest periods for the next 24 hours.  Walking will help get rid of the air and reduce the bloated feeling in your belly (abdomen).  No driving for 24 hours (because of the medicine (anesthesia) used during the test).   Do not sign any important legal documents or operate any machinery for 24 hours (because of the anesthesia used during the test).  NUTRITION Drink plenty of fluids.  You may resume your normal diet as instructed by your doctor.  Begin with a light meal and progress to your normal diet. Heavy or fried foods are harder to digest and may make you feel sick to your stomach (nauseated).  Avoid alcoholic beverages for 24 hours or as instructed.  MEDICATIONS You may resume your normal medications unless your doctor tells you otherwise.  WHAT YOU CAN EXPECT TODAY Some feelings of bloating in the abdomen.  Passage of more gas than usual.  Spotting of blood in your stool or on the toilet paper.  IF YOU HAD POLYPS REMOVED DURING THE COLONOSCOPY: No aspirin products for 7 days or as instructed.  No alcohol for 7 days or as instructed.  Eat a soft diet for the next 24 hours.  FINDING OUT THE RESULTS OF YOUR TEST Not all test results are available during your visit. If your test results are not back during the visit, make an appointment with your caregiver to find out the results. Do not assume everything is normal if you have not heard from your  caregiver or the medical facility. It is important for you to follow up on all of your test results.  SEEK IMMEDIATE MEDICAL ATTENTION IF: You have more than a spotting of blood in your stool.  Your belly is swollen (abdominal distention).  You are nauseated or vomiting.  You have a temperature over 101.  You have abdominal pain or discomfort that is severe or gets worse throughout the day.   Your colonoscopy revealed 2 polyp(s) which I removed successfully. Await pathology results, my office will contact you. I recommend repeating colonoscopy in 5 years for surveillance purposes. Otherwise follow up as needed.    I hope you have a great rest of your week!  Elon Alas. Abbey Chatters, D.O. Gastroenterology and Hepatology [facility]

## 2022-05-13 NOTE — Transfer of Care (Signed)
Immediate Anesthesia Transfer of Care Note  Patient: Stephen Marshall  Procedure(s) Performed: COLONOSCOPY WITH PROPOFOL POLYPECTOMY  Patient Location: Endoscopy Unit  Anesthesia Type:General  Level of Consciousness: awake  Airway & Oxygen Therapy: Patient Spontanous Breathing  Post-op Assessment: Report given to RN and Post -op Vital signs reviewed and stable  Post vital signs: Reviewed and stable  Last Vitals:  Vitals Value Taken Time  BP 97/60 05/13/22 0812  Temp 36.6 C 05/13/22 0812  Pulse 82 05/13/22 0812  Resp 17 05/13/22 0812  SpO2 95 % 05/13/22 0812    Last Pain:  Vitals:   05/13/22 0812  TempSrc: Axillary  PainSc: 0-No pain      Patients Stated Pain Goal: 7 (17/71/16 5790)  Complications: No notable events documented.

## 2022-05-13 NOTE — Anesthesia Preprocedure Evaluation (Addendum)
Anesthesia Evaluation  Patient identified by MRN, date of birth, ID band Patient awake    Reviewed: Allergy & Precautions, H&P , NPO status , Patient's Chart, lab work & pertinent test results  Airway Mallampati: II  TM Distance: >3 FB Neck ROM: Full    Dental  (+) Dental Advisory Given, Teeth Intact   Pulmonary sleep apnea (resolved after losing weight) , Current Smoker and Patient abstained from smoking.   Pulmonary exam normal breath sounds clear to auscultation       Cardiovascular negative cardio ROS Normal cardiovascular exam Rhythm:Regular Rate:Normal     Neuro/Psych  PSYCHIATRIC DISORDERS Anxiety     negative neurological ROS     GI/Hepatic negative GI ROS, Neg liver ROS,,,  Endo/Other  negative endocrine ROS    Renal/GU negative Renal ROS  negative genitourinary   Musculoskeletal negative musculoskeletal ROS (+)    Abdominal   Peds negative pediatric ROS (+)  Hematology negative hematology ROS (+)   Anesthesia Other Findings   Reproductive/Obstetrics negative OB ROS                             Anesthesia Physical Anesthesia Plan  ASA: 2  Anesthesia Plan: General   Post-op Pain Management: Minimal or no pain anticipated   Induction: Intravenous  PONV Risk Score and Plan: 1 and Propofol infusion  Airway Management Planned: Nasal Cannula and Natural Airway  Additional Equipment:   Intra-op Plan:   Post-operative Plan:   Informed Consent: I have reviewed the patients History and Physical, chart, labs and discussed the procedure including the risks, benefits and alternatives for the proposed anesthesia with the patient or authorized representative who has indicated his/her understanding and acceptance.     Dental advisory given  Plan Discussed with: CRNA and Surgeon  Anesthesia Plan Comments:        Anesthesia Quick Evaluation

## 2022-05-13 NOTE — Op Note (Signed)
[facility] Patient Name: Stephen Marshall Procedure Date: 05/13/2022 7:47 AM MRN: [id number] Date of Birth: Apr 30, 1970 Attending MD: Elon Alas. Abbey Chatters , [place], [id number] CSN: [id number] Age: 52 Admit Type: Outpatient Procedure:                Colonoscopy Indications:              Screening for colorectal malignant neoplasm Providers:                Elon Alas. Abbey Chatters, DO, Tammy Vaught, RN, Wynonia Musty Tech, Technician Referring MD:              Medicines:                See the Anesthesia note for documentation of the                            administered medications Complications:            No immediate complications. Estimated Blood Loss:     Estimated blood loss was minimal. Procedure:                Pre-Anesthesia Assessment:                           - The anesthesia plan was to use monitored                            anesthesia care (MAC).                           After obtaining informed consent, the colonoscope                            was passed under direct vision. Throughout the                            procedure, the patient's blood pressure, pulse, and                            oxygen saturations were monitored continuously. The                            [id number]-HQ190L ([id number]) scope was introduced through                            the anus and advanced to the the cecum, identified                            by appendiceal orifice and ileocecal valve. The                            colonoscopy was performed without difficulty. The                            patient tolerated the procedure  well. The quality                            of the bowel preparation was evaluated using the                            BBPS [facility] Bowel Preparation Scale) with scores                            of: Right Colon = 3, Transverse Colon = 3 and Left                            Colon = 3 (entire mucosa seen well with no residual                             staining, small fragments of stool or opaque                            liquid). The total BBPS score equals 9. Scope In: 7:56:30 AM Scope Out: 8:10:13 AM Scope Withdrawal Time: 0 hours 12 minutes 1 second  Total Procedure Duration: 0 hours 13 minutes 43 seconds  Findings:      The perianal and digital rectal examinations were normal.      A 7 mm polyp was found in the ascending colon. The polyp was sessile.       The polyp was removed with a cold snare. Resection and retrieval were       complete.      An 8 mm polyp was found in the sigmoid colon. The polyp was       pedunculated. The polyp was removed with a cold snare. Resection and       retrieval were complete.      The exam was otherwise without abnormality on direct and retroflexion       views. Impression:               - One 7 mm polyp in the ascending colon, removed                            with a cold snare. Resected and retrieved.                           - One 8 mm polyp in the sigmoid colon, removed with                            a cold snare. Resected and retrieved.                           - The examination was otherwise normal on direct                            and retroflexion views. Moderate Sedation:      Per Anesthesia Care Recommendation:           - Patient has a contact number available for  emergencies. The signs and symptoms of potential                            delayed complications were discussed with the                            patient. Return to normal activities tomorrow.                            Written discharge instructions were provided to the                            patient.                           - Resume previous diet.                           - Continue present medications.                           - Await pathology results.                           - Repeat colonoscopy in 5 years for surveillance.                           - Return to GI  clinic PRN. Procedure Code(s):        --- Professional ---                           [phone], Colonoscopy, flexible; with removal of                            tumor(s), polyp(s), or other lesion(s) by snare                            technique Diagnosis Code(s):        --- Professional ---                           Z12.11, Encounter for screening for malignant                            neoplasm of colon                           D12.2, Benign neoplasm of ascending colon                           D12.5, Benign neoplasm of sigmoid colon CPT copyright 2022 American Medical Association. All rights reserved. The codes documented in this report are preliminary and upon coder review may  be revised to meet current compliance requirements. Elon Alas. Abbey Chatters, DO [place] Abbey Chatters, DO 05/13/2022 8:12:33 AM This report has been signed electronically. Number of Addenda: 0

## 2022-05-13 NOTE — Anesthesia Postprocedure Evaluation (Signed)
Anesthesia Post Note  Patient: Stephen Marshall  Procedure(s) Performed: COLONOSCOPY WITH PROPOFOL POLYPECTOMY  Patient location during evaluation: Phase II Anesthesia Type: General Level of consciousness: awake and alert and oriented Pain management: pain level controlled Vital Signs Assessment: post-procedure vital signs reviewed and stable Respiratory status: spontaneous breathing, nonlabored ventilation and respiratory function stable Cardiovascular status: blood pressure returned to baseline and stable Postop Assessment: no apparent nausea or vomiting Anesthetic complications: no  No notable events documented.   Last Vitals:  Vitals:   05/13/22 0812 05/13/22 0814  BP: 97/60 105/73  Pulse: 82 83  Resp: 17 20  Temp: 36.6 C   SpO2: 95% 96%    Last Pain:  Vitals:   05/13/22 0814  TempSrc:   PainSc: 0-No pain                 Samnang Shugars C Karnell Vanderloop

## 2022-05-13 NOTE — H&P (Signed)
Primary Care Physician:  Celene Squibb, MD Primary Gastroenterologist:  Dr. Abbey Chatters  Pre-Procedure History & Physical: HPI:  Stephen Marshall is a 52 y.o. male is here for first ever colonoscopy for colon cancer screening purposes.  Patient denies any family history of colorectal cancer.  No melena or hematochezia.  No abdominal pain or unintentional weight loss.  No change in bowel habits.  Overall feels well from a GI standpoint.  Past Medical History:  Diagnosis Date   High cholesterol     History reviewed. No pertinent surgical history.  Prior to Admission medications   Medication Sig Start Date End Date Taking? Authorizing Provider  atorvastatin (LIPITOR) 80 MG tablet Take 80 mg by mouth daily. 02/15/21  Yes [provider]  buPROPion (WELLBUTRIN SR) 150 MG 12 hr tablet Take 150 mg by mouth 2 (two) times daily. 02/15/21  Yes [provider]  busPIRone (BUSPAR) 15 MG tablet Take 7.5 mg by mouth 3 (three) times daily. 02/18/21  Yes [provider]  Cholecalciferol (VITAMIN D3 PO) Take 1 capsule by mouth daily.   Yes [provider]  MAGNESIUM PO Take 1 tablet by mouth daily.   Yes [provider]  OTEZLA 30 MG TABS Take 30 mg by mouth 2 (two) times daily. 05/21/21  Yes [provider]  rOPINIRole (REQUIP) 4 MG tablet Take 4 mg by mouth at bedtime. 03/29/21  Yes [provider]  nitroGLYCERIN (NITROSTAT) 0.4 MG SL tablet Place 0.4 mg under the tongue every 5 (five) minutes as needed for chest pain (as needed for chest pain).    [provider]    Allergies as of 04/19/2022   (No Known Allergies)    Family History  Problem Relation Age of Onset   Heart attack Father 61   Cancer Father    Stroke Father 6   Heart attack Maternal Grandfather 60    Social History   Socioeconomic History   Marital status: Married    Spouse name: Not on file   Number of children: Not on file   Years of education: Not on file    Highest education level: Not on file  Occupational History   Not on file  Tobacco Use   Smoking status: Every Day    Types: E-cigarettes   Smokeless tobacco: Never   Tobacco comments:    vape  Substance and Sexual Activity   Alcohol use: Yes   Drug use: No   Sexual activity: Yes    Partners: Female  Other Topics Concern   Not on file  Social History Narrative   ** Merged History Encounter **       Social Determinants of Health   Financial Resource Strain: Not on file  Food Insecurity: Not on file  Transportation Needs: Not on file  Physical Activity: Not on file  Stress: Not on file  Social Connections: Not on file  Intimate Partner Violence: Not on file    Review of Systems: See HPI, otherwise negative ROS  Physical Exam: Vital signs in last 24 hours: Temp:  [98.4 F (36.9 C)] 98.4 F (36.9 C) (02/02 0655) Pulse Rate:  [83] 83 (02/02 0655) Resp:  [12] 12 (02/02 0655) BP: (124)/(83) 124/83 (02/02 0655) SpO2:  [95 %] 95 % (02/02 0655) Weight:  [106.6 kg] 106.6 kg (02/02 0655)   General:   Alert,  Well-developed, well-nourished, pleasant and cooperative in NAD Head:  Normocephalic and atraumatic. Eyes:  Sclera clear, no icterus.   Conjunctiva  pink. Ears:  Normal auditory acuity. Nose:  No deformity, discharge,  or lesions. Msk:  Symmetrical without gross deformities. Normal posture. Extremities:  Without clubbing or edema. Neurologic:  Alert and  oriented x4;  grossly normal neurologically. Skin:  Intact without significant lesions or rashes. Psych:  Alert and cooperative. Normal mood and affect.  Impression/Plan: Stephen Marshall is here for a colonoscopy to be performed for colon cancer screening purposes.  The risks of the procedure including infection, bleed, or perforation as well as benefits, limitations, alternatives and imponderables have been reviewed with the patient. Questions have been answered. All parties agreeable.

## 2022-05-16 LAB — SURGICAL PATHOLOGY

## 2022-05-20 ENCOUNTER — Encounter (HOSPITAL_COMMUNITY): Payer: Self-pay | Admitting: Internal Medicine

## 2022-08-25 IMAGING — CT CT HEART MORP W/ CTA COR W/ SCORE W/ CA W/CM &/OR W/O CM
4 of 7 series · 8 of 20 positions shown, 9 images · IV contrast (APPLIED)
Comparison: Chest radiograph 10/13/2016
COMPARISON: Chest radiograph 10/13/2016

Addendum:
EXAM:
OVER-READ INTERPRETATION  CT CHEST

The following report is an over-read performed by radiologist Dr.
Mmgp Pinrang [REDACTED] on 06/15/2021. This
over-read does not include interpretation of cardiac or coronary
anatomy or pathology. The coronary CTA interpretation by the
cardiologist is attached.
CLINICAL DATA: Chest pain
Cardiac CTA
MEDICATIONS:
Sub lingual nitro. 4mg x 2
TECHNIQUE: The patient was scanned on a Siemens [REDACTED]ice scanner. Gantry
rotation speed was 250 msecs. Collimation was 0.6 mm. A 100 kV
prospective scan was triggered in the ascending thoracic aorta at
35-75% of the R-R interval. Average HR during the scan was 60 bpm.
The 3D data set was interpreted on a dedicated work station using
MPR, MIP and VRT modes. A total of 80cc of contrast was used.

[Series 6: ts diast sharp · axial · 0.39mm/px · z∈[+1154,+1196]mm · 2 of 313 slices shown]
[im 105/313  lung]
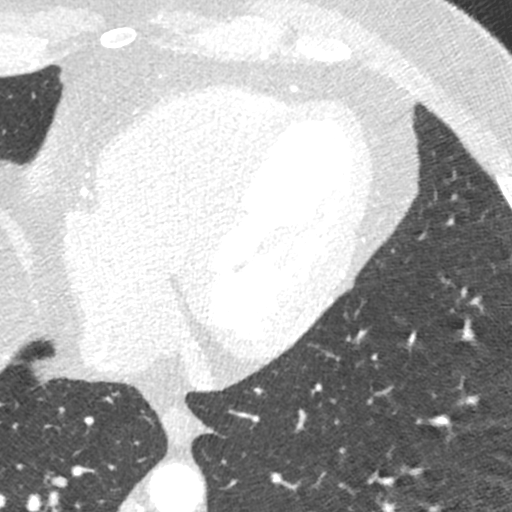
[im 209/313  lung]
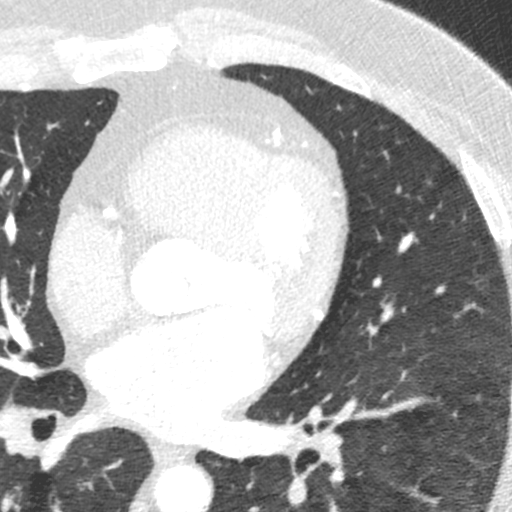

[Series 7: ts syst sharp · axial · 0.39mm/px · z∈[+1154,+1196]mm · 2 of 313 slices shown]
[im 105/313  lung]
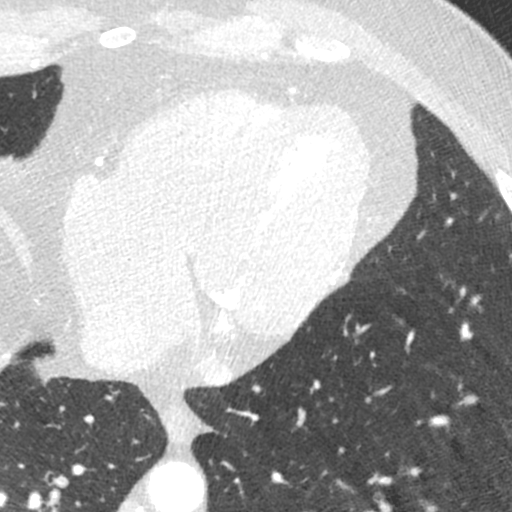
[im 209/313  lung]
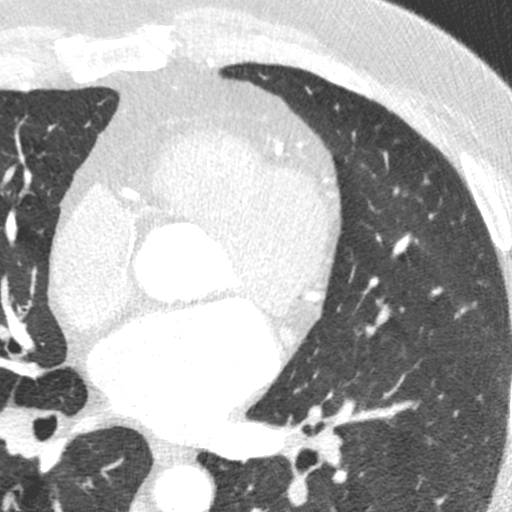

[Series 8: best syst · axial · 0.39mm/px · z∈[+1154,+1196]mm · 2 of 313 slices shown, 3 images]
[im 105/313  vessel]
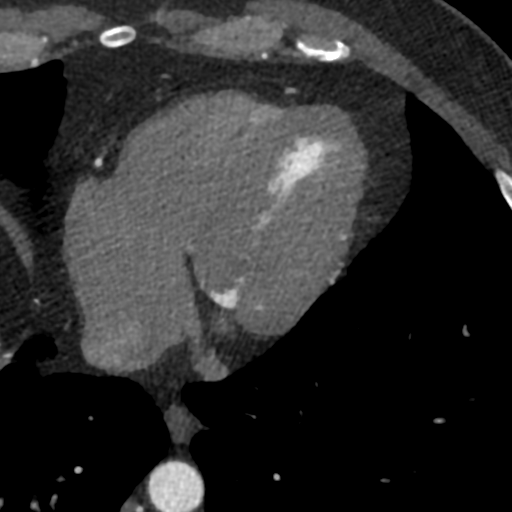
[im 105/313  lung]
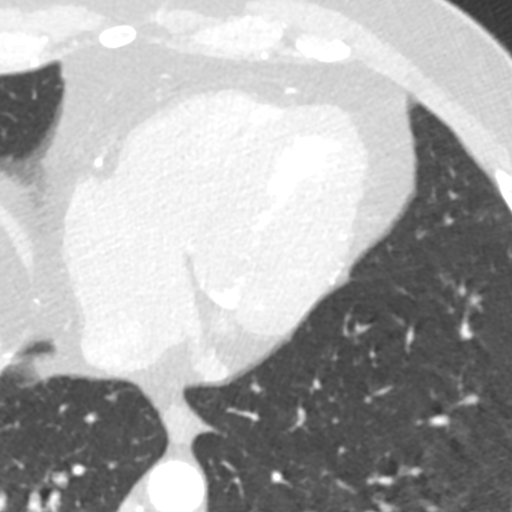
[im 209/313  vessel]
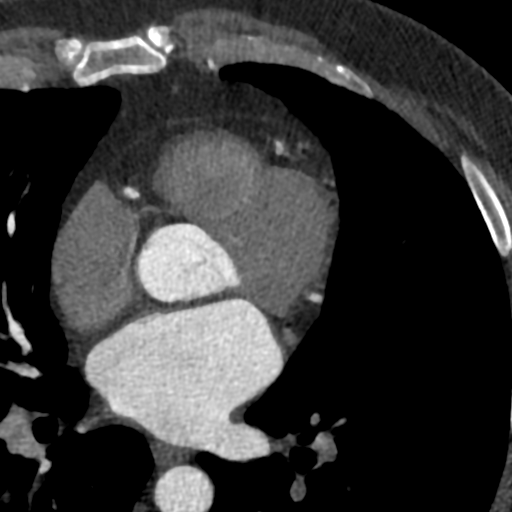

[Series 9: best diast · axial · 0.39mm/px · z∈[+1154,+1196]mm · 2 of 313 slices shown]
[im 105/313  vessel]
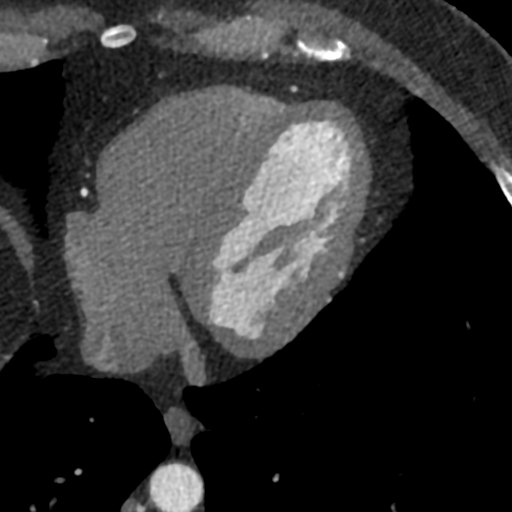
[im 209/313  vessel]
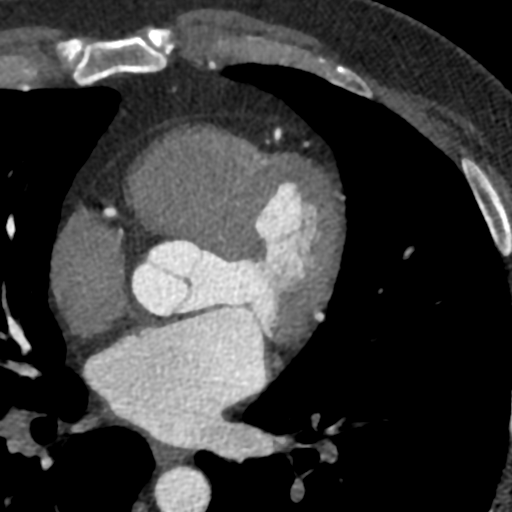

[8 of 20 positions shown; findings below may reference images not displayed]

FINDINGS: Vascular: Unremarkable (this excludes cardiac and coronary
assessment)

Mediastinum/Nodes: Unremarkable

Lungs/Pleura: Unremarkable

Upper Abdomen: Unremarkable

Musculoskeletal: Unremarkable
IMPRESSION: No significant extracardiac findings.
FINDINGS: Non-cardiac: See separate report from [REDACTED].

No LA appendage thrombus. The pulmonary veins drain normally to the
left atrium.

Calcium Score: 74.7 Agatston units.

Coronary Arteries: Right dominant with no anomalies

LM: Calcified plaque with minimal stenosis.

LAD system: Mixed plaque proximal LAD with minimal stenosis.

Circumflex system: Large ramus, very small AV LCx. There was
calcified plaque in a small branch of the ramus with mild (1-24%)
stenosis.

RCA system: Mixed plaque mid RCA, mild (25-49%) stenosis.
IMPRESSION: 1. Coronary artery calcium score 74.7 Agatston units. This places
the patient in the 98th percentile for age and gender, suggesting
high risk for future cardiac events.

2.  Nonobstructive coronary disease.

Henrry Estuardo Kanil

*** End of Addendum ***
EXAM:
OVER-READ INTERPRETATION  CT CHEST

The following report is an over-read performed by radiologist Dr.
Mmgp Pinrang [REDACTED] on 06/15/2021. This
over-read does not include interpretation of cardiac or coronary
anatomy or pathology. The coronary CTA interpretation by the
cardiologist is attached.
FINDINGS: Vascular: Unremarkable (this excludes cardiac and coronary
assessment)

Mediastinum/Nodes: Unremarkable

Lungs/Pleura: Unremarkable

Upper Abdomen: Unremarkable

Musculoskeletal: Unremarkable
IMPRESSION: No significant extracardiac findings.

## 2023-05-03 ENCOUNTER — Ambulatory Visit: Admission: EM | Admit: 2023-05-03 | Discharge: 2023-05-03 | Discharge status: Against Medical Advice | Payer: 59

## 2023-05-03 NOTE — ED Notes (Signed)
Pt presents to UC for RSV testing. Pt reports spouse tested positive at [facility] and would also like to be tested. Consulted NP and reported to discuss that RSV testing is not offered in pt age group at [facility], would be more than happy to see pt, and discuss symptom management but testing would not change recommended treatment.Discussed with pt. Pt reports "I dont have a lot of symptoms but just wanted to be tested because of close exposure." Pt reported " I dont need to be here then if I can't get tested." Pt left UC.  Pt alert and oriented. NAD noted. Provider aware.

## 2023-08-01 ENCOUNTER — Other Ambulatory Visit (HOSPITAL_COMMUNITY): Payer: Self-pay | Admitting: Internal Medicine

## 2023-08-01 DIAGNOSIS — E782 Mixed hyperlipidemia: Secondary | ICD-10-CM

## 2023-08-09 ENCOUNTER — Ambulatory Visit (HOSPITAL_COMMUNITY)
Admission: RE | Admit: 2023-08-09 | Discharge: 2023-08-09 | Disposition: A | Discharge status: Home / Self Care | Payer: Self-pay | Source: Ambulatory Visit | Attending: Internal Medicine | Admitting: Internal Medicine

## 2023-08-09 DIAGNOSIS — E782 Mixed hyperlipidemia: Secondary | ICD-10-CM | POA: Insufficient documentation

## 2023-11-10 DIAGNOSIS — E782 Mixed hyperlipidemia: Secondary | ICD-10-CM | POA: Diagnosis not present

## 2023-11-16 DIAGNOSIS — D751 Secondary polycythemia: Secondary | ICD-10-CM | POA: Diagnosis not present

## 2023-11-16 DIAGNOSIS — G2581 Restless legs syndrome: Secondary | ICD-10-CM | POA: Diagnosis not present

## 2023-11-16 DIAGNOSIS — R7301 Impaired fasting glucose: Secondary | ICD-10-CM | POA: Diagnosis not present

## 2023-11-16 DIAGNOSIS — Z0000 Encounter for general adult medical examination without abnormal findings: Secondary | ICD-10-CM | POA: Diagnosis not present

## 2023-11-16 DIAGNOSIS — L309 Dermatitis, unspecified: Secondary | ICD-10-CM | POA: Diagnosis not present

## 2023-11-16 DIAGNOSIS — E782 Mixed hyperlipidemia: Secondary | ICD-10-CM | POA: Diagnosis not present

## 2023-11-23 DIAGNOSIS — L309 Dermatitis, unspecified: Secondary | ICD-10-CM | POA: Diagnosis not present

## 2023-11-23 DIAGNOSIS — L810 Postinflammatory hyperpigmentation: Secondary | ICD-10-CM | POA: Diagnosis not present

## 2023-12-18 DIAGNOSIS — E6609 Other obesity due to excess calories: Secondary | ICD-10-CM | POA: Diagnosis not present

## 2023-12-18 DIAGNOSIS — M9903 Segmental and somatic dysfunction of lumbar region: Secondary | ICD-10-CM | POA: Diagnosis not present

## 2023-12-18 DIAGNOSIS — M25561 Pain in right knee: Secondary | ICD-10-CM | POA: Diagnosis not present

## 2023-12-18 DIAGNOSIS — M9905 Segmental and somatic dysfunction of pelvic region: Secondary | ICD-10-CM | POA: Diagnosis not present

## 2023-12-18 DIAGNOSIS — M9902 Segmental and somatic dysfunction of thoracic region: Secondary | ICD-10-CM | POA: Diagnosis not present

## 2023-12-18 DIAGNOSIS — M62830 Muscle spasm of back: Secondary | ICD-10-CM | POA: Diagnosis not present

## 2024-01-24 DIAGNOSIS — L209 Atopic dermatitis, unspecified: Secondary | ICD-10-CM | POA: Diagnosis not present

## 2024-01-24 DIAGNOSIS — M7032 Other bursitis of elbow, left elbow: Secondary | ICD-10-CM | POA: Diagnosis not present
